# Patient Record
Sex: Female | Born: 1960 | Race: White | Hispanic: No | Marital: Married | Smoking: Never smoker
Health system: Southern US, Community
[De-identification: ages and names within clinical notes are randomized; demographics above are authoritative.]

## PROBLEM LIST (undated history)

## (undated) DIAGNOSIS — IMO0001 Reserved for inherently not codable concepts without codable children: Secondary | ICD-10-CM

## (undated) DIAGNOSIS — M069 Rheumatoid arthritis, unspecified: Secondary | ICD-10-CM

## (undated) DIAGNOSIS — K219 Gastro-esophageal reflux disease without esophagitis: Secondary | ICD-10-CM

## (undated) HISTORY — DX: Gastro-esophageal reflux disease without esophagitis: K21.9

## (undated) HISTORY — DX: Reserved for inherently not codable concepts without codable children: IMO0001

## (undated) HISTORY — PX: ABLATION: SHX5711

---

## 2004-11-29 ENCOUNTER — Ambulatory Visit: Payer: Self-pay | Admitting: Gastroenterology

## 2005-12-30 ENCOUNTER — Ambulatory Visit: Payer: Self-pay | Admitting: Unknown Physician Specialty

## 2007-08-09 ENCOUNTER — Observation Stay (HOSPITAL_COMMUNITY): Admission: EM | Admit: 2007-08-09 | Discharge: 2007-08-10 | Payer: Self-pay | Admitting: Emergency Medicine

## 2009-04-12 ENCOUNTER — Ambulatory Visit: Payer: Self-pay | Admitting: Unknown Physician Specialty

## 2009-10-16 ENCOUNTER — Ambulatory Visit: Payer: Self-pay | Admitting: Unknown Physician Specialty

## 2010-11-27 ENCOUNTER — Ambulatory Visit: Payer: Self-pay | Admitting: Unknown Physician Specialty

## 2010-11-29 ENCOUNTER — Ambulatory Visit: Payer: Self-pay | Admitting: Unknown Physician Specialty

## 2011-02-05 NOTE — Discharge Summary (Signed)
NAMECHENELLE, BENNING                ACCOUNT NO.:  [id number]   MEDICAL RECORD NO.:  [id number]          PATIENT TYPE:  INP   LOCATION:  3729                         FACILITY:  [facility]   PHYSICIAN:  Ritta Slot, MD     DATE OF BIRTH:  1960-11-04   DATE OF ADMISSION:  08/09/2007  DATE OF DISCHARGE:  08/10/2007                               DISCHARGE SUMMARY   Ms. Neal is a 50 year old white female who presents to [facility] ER  with complaints of palpitations with associated arm and chest  discomfort.  Symptoms began approximately one month ago.  She had been  seen by Dr. Lynnea Ferrier in the office and she had undergone an echo and was  awaiting monitor placement.  She has a repeat appointment to see him on  November 19.  She got scared; thus, she came to the emergency room.  It  was decided to keep her overnight for observation.  She ruled out for an  MI with negative CK-MBs and troponins.  Dr. Lynnea Ferrier saw her the  following morning and decided that she could be discharged home for  monitor placement.  This was setup prior to her discharge.  No  medication changes were made.  Discharge medications will be Prozac 20  mg everyday, Nexium 40 mg everyday.  She will have her Event monitor  placed at 8:30 on August 12, 2007.   LABS:  Hemoglobin 12.5, hematocrit 36.3, WBC 6.1 and platelets are 205.  Sodium 141, potassium 3.8, BUN 12, creatinine 0.71.  Magnesium was 2.3.  On admission, her potassium was slightly low at 3.3, that was replaced.  Total cholesterol was 171, but triglycerides 52, HDL was 50 and LDL was  111.   DISCHARGE DIAGNOSES:  1. Palpitations.  2. Gastroesophageal reflux disease.  3. Anxiety.      Lezlie Octave, N.P.      Ritta Slot, MD  Electronically Signed    BB/MEDQ  D:  08/10/2007  T:  08/10/2007  Job:  [id number]

## 2011-07-02 LAB — LIPID PANEL
HDL: 50
LDL Cholesterol: 111 — ABNORMAL HIGH
Total CHOL/HDL Ratio: 3.4
Triglycerides: 52
VLDL: 10

## 2011-07-02 LAB — I-STAT 8, (EC8 V) (CONVERTED LAB)
Acid-Base Excess: 1
BUN: 16
Bicarbonate: 25.1 — ABNORMAL HIGH
Chloride: 105
Glucose, Bld: 84
Operator id: 272551
Potassium: 3.3 — ABNORMAL LOW
Sodium: 137
TCO2: 26
pH, Ven: 7.435 — ABNORMAL HIGH

## 2011-07-02 LAB — BASIC METABOLIC PANEL
Calcium: 8.9
GFR calc Af Amer: >60
GFR calc non Af Amer: >60
Glucose, Bld: 89
Potassium: 3.8

## 2011-07-02 LAB — POCT CARDIAC MARKERS
CKMB, poc: <1 — ABNORMAL LOW
Myoglobin, poc: 54.2

## 2011-07-02 LAB — DIFFERENTIAL
Basophils Relative: 0
Eosinophils Relative: 1
Monocytes Relative: 7
Neutrophils Relative %: 63

## 2011-07-02 LAB — COMPREHENSIVE METABOLIC PANEL
Alkaline Phosphatase: 37 — ABNORMAL LOW
Chloride: 103
Potassium: 4.1
Total Protein: 6.5

## 2011-07-02 LAB — CBC
Hemoglobin: 12.5
MCHC: 34.2
MCHC: 34.4
MCV: 88.9
Platelets: 205
Platelets: 232
RBC: 4.3
WBC: 6.1
WBC: 7.6

## 2011-07-02 LAB — TROPONIN I
Troponin I: 0.02
Troponin I: 0.03

## 2011-07-02 LAB — CK TOTAL AND CKMB (NOT AT ARMC): Total CK: 39

## 2011-07-02 LAB — POCT I-STAT CREATININE
Creatinine, Ser: 0.9
Operator id: 272551

## 2011-07-02 LAB — APTT: aPTT: 34

## 2011-07-02 LAB — MAGNESIUM: Magnesium: 2.3

## 2011-12-03 ENCOUNTER — Ambulatory Visit: Payer: Self-pay | Admitting: Unknown Physician Specialty

## 2013-05-26 ENCOUNTER — Ambulatory Visit: Payer: Self-pay | Admitting: Obstetrics and Gynecology

## 2013-06-15 DIAGNOSIS — R7303 Prediabetes: Secondary | ICD-10-CM | POA: Insufficient documentation

## 2013-06-15 DIAGNOSIS — N943 Premenstrual tension syndrome: Secondary | ICD-10-CM | POA: Insufficient documentation

## 2013-06-15 DIAGNOSIS — K219 Gastro-esophageal reflux disease without esophagitis: Secondary | ICD-10-CM | POA: Insufficient documentation

## 2013-06-15 DIAGNOSIS — E785 Hyperlipidemia, unspecified: Secondary | ICD-10-CM | POA: Insufficient documentation

## 2013-06-15 DIAGNOSIS — L709 Acne, unspecified: Secondary | ICD-10-CM | POA: Insufficient documentation

## 2014-01-20 DIAGNOSIS — Z1331 Encounter for screening for depression: Secondary | ICD-10-CM | POA: Insufficient documentation

## 2014-01-20 DIAGNOSIS — K7689 Other specified diseases of liver: Secondary | ICD-10-CM | POA: Insufficient documentation

## 2014-07-29 ENCOUNTER — Ambulatory Visit (INDEPENDENT_AMBULATORY_CARE_PROVIDER_SITE_OTHER): Payer: BC Managed Care – PPO

## 2014-07-29 ENCOUNTER — Encounter: Payer: Self-pay | Admitting: *Deleted

## 2014-07-29 ENCOUNTER — Ambulatory Visit (INDEPENDENT_AMBULATORY_CARE_PROVIDER_SITE_OTHER): Payer: BC Managed Care – PPO | Admitting: Podiatry

## 2014-07-29 ENCOUNTER — Encounter: Payer: Self-pay | Admitting: Podiatry

## 2014-07-29 ENCOUNTER — Other Ambulatory Visit: Payer: Self-pay | Admitting: *Deleted

## 2014-07-29 VITALS — BP 121/81 | HR 89 | Resp 16 | Ht 67.0 in | Wt 170.0 lb

## 2014-07-29 DIAGNOSIS — M722 Plantar fascial fibromatosis: Secondary | ICD-10-CM

## 2014-07-29 MED ORDER — TRIAMCINOLONE ACETONIDE 10 MG/ML IJ SUSP
10.0000 mg | Freq: Once | INTRAMUSCULAR | Status: AC
Start: 2014-07-29 — End: 2014-07-29
  Administered 2014-07-29: 10 mg

## 2014-07-29 MED ORDER — PREDNISONE 10 MG PO TABS: ORAL_TABLET | ORAL | Status: DC

## 2014-07-29 NOTE — Patient Instructions (Signed)

## 2014-07-29 NOTE — Progress Notes (Signed)
   Subjective:    Patient ID: Deborah BullsKaren L Kellman, female    DOB: 07/02/1961, 53 y.o.   MRN: [id number]  HPI Comments: Pain in both feet for years now, the last year has been worse. The balls and the heels hurt.   They hurt after being on them for a little while   Foot Pain      Review of Systems  All other systems reviewed and are negative.      Objective:   Physical Exam        Assessment & Plan:

## 2014-07-31 NOTE — Progress Notes (Signed)
Subjective:     Patient ID: Deborah West, female   DOB: 12/11/1960, 53 y.o.   MRN: [id number]  HPIpatient states she's getting a lot of pain in the heels and forefeet of both feet that is not occur after first waking up in the morning but seems to get worse as the day goes on. States that it's reaching a point where it's becoming very hard for her to be on her feet or to be active   Review of Systems  All other systems reviewed and are negative.      Objective:   Physical Exam  Constitutional: She is oriented to person, place, and time.  Cardiovascular: Intact distal pulses.   Musculoskeletal: Normal range of motion.  Neurological: She is oriented to person, place, and time.  Skin: Skin is warm.  Nursing note and vitals reviewed. neurovascular status intact with muscle strength adequate and range of motion subtalar and midtarsal joint within normal limits. Patient is noted to have quite a bit of discomfort in the plantar aspects of the heels of both feet and moderate discomfort in the forefeet of both feet with inflammation around the metatarsals that is not as acute as the heel. Patient is noted to have good digital perfusion and is well oriented 3     Assessment:     Inflammatory plantar fasciitis of both feet and probable compensatory capsulitis bilateral    Plan:     H&P and x-rays reviewed. Today I injected the plantar fascia 3 mg Kenalog 5 mg Xylocaine and went ahead and applied fascially brace to both feet in order to lift the arch. Patient will be seen back to recheck

## 2014-08-12 ENCOUNTER — Ambulatory Visit (INDEPENDENT_AMBULATORY_CARE_PROVIDER_SITE_OTHER): Payer: BC Managed Care – PPO | Admitting: Podiatry

## 2014-08-12 VITALS — BP 131/74 | HR 90 | Resp 16

## 2014-08-12 DIAGNOSIS — M722 Plantar fascial fibromatosis: Secondary | ICD-10-CM

## 2014-08-12 NOTE — Progress Notes (Signed)
Subjective:     Patient ID: Deborah West, female   DOB: 09/17/1961, 53 y.o.   MRN: [id number]  HPI patient presents stating her heels are feeling quite a bit better than they were but still sore and she has had this for a long time   Review of Systems     Objective:   Physical Exam Neurovascular status intact with discomfort in the right plantar fascia and left plantar fascia of moderate intensity when palpated with moderate depression of the arch noted upon weightbearing    Assessment:     Continued plantar fasciitis of both heels secondary to structure with improvement with medication    Plan:     Finish medicine and instructed on physical therapy and scanned for custom orthotic devices. Reappoint when orthotics returned

## 2014-08-22 ENCOUNTER — Telehealth: Payer: Self-pay | Admitting: *Deleted

## 2014-08-22 NOTE — Telephone Encounter (Signed)
Pt called said she is having pain and she is wanting something so the inflammation will not build back up. Pt states she took cataflam wed-sun and it did help her. What do you suggest?

## 2014-08-22 NOTE — Telephone Encounter (Signed)
Called and left message letting pt know i received her phone call and no one was here on Wednesday to get her call requesting something for pain so it can get her through the Thanksgiving holiday.

## 2014-08-24 ENCOUNTER — Telehealth: Payer: Self-pay | Admitting: *Deleted

## 2014-08-24 NOTE — Telephone Encounter (Signed)
Called and left message for pt to return call. Needing to know mg for cataflam and how often she takes it.

## 2014-08-24 NOTE — Telephone Encounter (Signed)
She can have cataflam. I don't know the dosage for it

## 2014-08-25 ENCOUNTER — Other Ambulatory Visit: Payer: Self-pay | Admitting: *Deleted

## 2014-08-25 MED ORDER — DICLOFENAC POTASSIUM 50 MG PO TABS: 50.0000 mg | ORAL_TABLET | Freq: Four times a day (QID) | ORAL | Status: DC | PRN

## 2014-08-25 NOTE — Telephone Encounter (Signed)
Pt called said she takes cataflam four times daily. Per dr Charlsie Merlesregal fill rx. #[phone] refills.

## 2014-09-02 ENCOUNTER — Ambulatory Visit (INDEPENDENT_AMBULATORY_CARE_PROVIDER_SITE_OTHER): Payer: BC Managed Care – PPO | Admitting: *Deleted

## 2014-09-02 ENCOUNTER — Encounter: Payer: Self-pay | Admitting: Podiatry

## 2014-09-02 VITALS — BP 120/80 | HR 88 | Resp 16

## 2014-09-02 DIAGNOSIS — M722 Plantar fascial fibromatosis: Secondary | ICD-10-CM

## 2014-09-02 NOTE — Patient Instructions (Signed)

## 2014-09-02 NOTE — Progress Notes (Signed)
Pt presents for orthotic pick up , written and verbal instructions are given follow up as needed

## 2014-09-30 ENCOUNTER — Ambulatory Visit: Payer: BC Managed Care – PPO | Admitting: Podiatry

## 2014-11-08 ENCOUNTER — Ambulatory Visit (INDEPENDENT_AMBULATORY_CARE_PROVIDER_SITE_OTHER): Payer: BC Managed Care – PPO | Admitting: Podiatry

## 2014-11-08 ENCOUNTER — Encounter: Payer: Self-pay | Admitting: Podiatry

## 2014-11-08 VITALS — BP 136/77 | HR 84 | Resp 16

## 2014-11-08 DIAGNOSIS — M722 Plantar fascial fibromatosis: Secondary | ICD-10-CM

## 2014-11-08 MED ORDER — TRIAMCINOLONE ACETONIDE 10 MG/ML IJ SUSP
10.0000 mg | Freq: Once | INTRAMUSCULAR | Status: AC
Start: 2014-11-08 — End: 2014-11-08
  Administered 2014-11-08: 10 mg

## 2014-11-08 MED ORDER — TRIAMCINOLONE ACETONIDE 10 MG/ML IJ SUSP
10.0000 mg | Freq: Once | INTRAMUSCULAR | Status: AC
  Administered 2014-11-08: 10 mg

## 2014-11-08 MED ORDER — DICLOFENAC SODIUM 75 MG PO TBEC: 75.0000 mg | DELAYED_RELEASE_TABLET | Freq: Two times a day (BID) | ORAL | Status: DC

## 2014-11-08 NOTE — Progress Notes (Signed)
Subjective:     Patient ID: Deborah West, female   DOB: 03/28/1961, 54 y.o.   MRN: [id number]  HPI patient states [place]'s going to [place] on Thursday and has developed severe pain again in the plantar heel of approximate 10 day duration with no history of trauma   Review of Systems     Objective:   Physical Exam Neurovascular status intact with muscle strength adequate and severe pain just distal to the insertion into the calcaneus of the plantar fascia bilateral    Assessment:     Acute plantar fasciitis heel bilateral    Plan:     Injected the plantar fashion 3 mg Kenalog 5 g Xylocaine and dispensed night splint for each foot in order to stretch and also scanned for custom orthotic devices. Patient will use aggressive ice oral anti-inflammatories consisting of diclofenac and will be seen back again in approximately 10 days

## 2014-11-18 ENCOUNTER — Ambulatory Visit: Payer: BC Managed Care – PPO | Admitting: Podiatry

## 2014-11-25 ENCOUNTER — Ambulatory Visit (INDEPENDENT_AMBULATORY_CARE_PROVIDER_SITE_OTHER): Payer: BC Managed Care – PPO | Admitting: Podiatry

## 2014-11-25 VITALS — BP 126/77 | HR 87 | Resp 16

## 2014-11-25 DIAGNOSIS — M722 Plantar fascial fibromatosis: Secondary | ICD-10-CM

## 2014-11-25 MED ORDER — PREDNISONE 10 MG PO TABS: ORAL_TABLET | ORAL | Status: DC

## 2014-11-25 MED ORDER — TRIAMCINOLONE ACETONIDE 10 MG/ML IJ SUSP: 10.0000 mg | Freq: Once | INTRAMUSCULAR | Status: AC

## 2014-11-25 MED ORDER — TRIAMCINOLONE ACETONIDE 10 MG/ML IJ SUSP
10.0000 mg | Freq: Once | INTRAMUSCULAR | Status: AC
Start: 2014-11-25 — End: 2014-11-25
  Administered 2014-11-25: 10 mg

## 2014-11-25 NOTE — Progress Notes (Signed)
Subjective:     Patient ID: Deborah West, female   DOB: 03/09/1961, 54 y.o.   MRN: [id number]  HPI patient states there still really hurting even though she is utilizing her night splints and her orthotics. Stated that she is somewhat better but she was in [place] and was on her feet a lot   Review of Systems     Objective:   Physical Exam Neurovascular status intact with muscle strength adequate range of motion within normal limits and moderate discomfort plantar aspect of the heels bilateral with inflammation and fluid around the medial band    Assessment:     Plantar fasciitis bilateral still present despite numerous conservative treatments    Plan:     Reviewed continuation of stretching continuation of night splints and today I reinjected the plantar fascia for the last time 3 mg Kenalog 5 g Xylocaine and we'll see back in 1 month and may need to consider possible shockwave therapy

## 2014-12-08 ENCOUNTER — Other Ambulatory Visit: Payer: Self-pay | Admitting: Podiatry

## 2014-12-23 ENCOUNTER — Encounter: Payer: Self-pay | Admitting: Podiatry

## 2014-12-23 ENCOUNTER — Ambulatory Visit (INDEPENDENT_AMBULATORY_CARE_PROVIDER_SITE_OTHER): Payer: BC Managed Care – PPO | Admitting: Podiatry

## 2014-12-23 VITALS — BP 110/66 | HR 85 | Resp 16

## 2014-12-23 DIAGNOSIS — M722 Plantar fascial fibromatosis: Secondary | ICD-10-CM | POA: Diagnosis not present

## 2014-12-23 NOTE — Progress Notes (Signed)
Subjective:     Patient ID: Deborah BullsKaren L Januszewski, female   DOB: 02/05/1961, 54 y.o.   MRN: [id number]  HPI patient states my heels are feeling pretty good with a significant diminishment of discomfort and ability to walk distances with only mild pain   Review of Systems     Objective:   Physical Exam Neurovascular status intact with muscle strength adequate range of motion within normal limits. Patient's noted to have discomfort within the plantar heel of both feet with fluid buildup around the medial band that has reduced but is still present    Assessment:     Plantar fasciitis which is improving bilateral    Plan:     Instructed on physical therapy anti-inflammatories and continued orthotic and night splint usage. Reappoint as needed if symptoms indicate

## 2015-03-08 ENCOUNTER — Ambulatory Visit (INDEPENDENT_AMBULATORY_CARE_PROVIDER_SITE_OTHER): Payer: BC Managed Care – PPO | Admitting: Podiatry

## 2015-03-08 ENCOUNTER — Encounter: Payer: Self-pay | Admitting: Podiatry

## 2015-03-08 VITALS — BP 129/64 | HR 81 | Resp 16

## 2015-03-08 DIAGNOSIS — M722 Plantar fascial fibromatosis: Secondary | ICD-10-CM | POA: Diagnosis not present

## 2015-03-08 MED ORDER — DICLOFENAC SODIUM 1 % TD GEL: 4.0000 g | Freq: Four times a day (QID) | TRANSDERMAL | Status: AC

## 2015-03-09 ENCOUNTER — Telehealth: Payer: Self-pay | Admitting: *Deleted

## 2015-03-09 NOTE — Telephone Encounter (Signed)
[employer] faxed Voltaren gel approval Case Id: [id number] effective 02/07/2015 to 03/08/2016.

## 2015-03-09 NOTE — Progress Notes (Signed)
She presents today for surgical consult regarding bilateral foot. She states that my heels are still hurting. I wear the orthotics on a regular basis and get no relief.  Objective: Vital signs are stable she's alert and oriented 3. Pulses are strongly palpable. She has pain on palpation medial calcaneal tubercles bilateral. Right greater than left.  Assessment: Fasciitis right greater than left.  Plan: Chronic intractable plantar fasciitis bilateral. Discussed the pros and cons of surgical intervention we discussed endoscopic plantar fasciotomy today. We went over the consent form line by line but over by number giving her ample time to S1 motion she saw fit regarding an endoscopic plantar fasciotomy. I answered all her questions regarding this procedure to the best of my ability in layman's terms. She understood and was amenable to it. We did discuss the possible postop complications which may include but are not limited to postop pain bleeding swelling infection recurrence and need for further surgery. She was dispensed a cam walker. We injected her bilateral heels today with Kenalog and local anesthesia and she will follow-up with [place] in the fall for surgical intervention.

## 2015-03-28 ENCOUNTER — Telehealth: Payer: Self-pay | Admitting: Podiatry

## 2015-03-28 NOTE — Telephone Encounter (Signed)
Pt called and is scheduled for surgery on 7.29 for her left foot/pt is requesting to change her surgery to the right foot because it is causing a lot of pain.Please advise pt if she can just change it or what does she need to do

## 2015-03-31 ENCOUNTER — Telehealth: Payer: Self-pay | Admitting: *Deleted

## 2015-03-31 NOTE — Telephone Encounter (Signed)
"  My right foot is worse than the left foot now.  I would like to change my surgery.  Please give me a call."

## 2015-03-31 NOTE — Telephone Encounter (Signed)
Left message for patient to return my call.

## 2015-04-03 ENCOUNTER — Telehealth: Payer: Self-pay | Admitting: *Deleted

## 2015-04-03 NOTE — Telephone Encounter (Signed)
"  Surgery is scheduled for the 29th.  I would like to have foot switched from left to right."

## 2015-04-04 NOTE — Telephone Encounter (Signed)
Pt will come in and resign paperwork for the right foot

## 2015-04-20 ENCOUNTER — Other Ambulatory Visit: Payer: Self-pay | Admitting: Podiatry

## 2015-04-20 MED ORDER — PROMETHAZINE HCL 25 MG PO TABS: 25.0000 mg | ORAL_TABLET | Freq: Three times a day (TID) | ORAL | Status: DC | PRN

## 2015-04-20 MED ORDER — CEPHALEXIN 500 MG PO CAPS: 500.0000 mg | ORAL_CAPSULE | Freq: Three times a day (TID) | ORAL | Status: DC

## 2015-04-20 MED ORDER — OXYCODONE-ACETAMINOPHEN 10-325 MG PO TABS: 1.0000 | ORAL_TABLET | Freq: Four times a day (QID) | ORAL | Status: DC | PRN

## 2015-04-21 ENCOUNTER — Encounter: Payer: Self-pay | Admitting: Podiatry

## 2015-04-21 DIAGNOSIS — M722 Plantar fascial fibromatosis: Secondary | ICD-10-CM | POA: Diagnosis not present

## 2015-04-26 ENCOUNTER — Encounter: Payer: Self-pay | Admitting: Podiatry

## 2015-04-26 ENCOUNTER — Ambulatory Visit (INDEPENDENT_AMBULATORY_CARE_PROVIDER_SITE_OTHER): Payer: BC Managed Care – PPO | Admitting: Podiatry

## 2015-04-26 VITALS — BP 137/71 | HR 85 | Resp 16

## 2015-04-26 DIAGNOSIS — M722 Plantar fascial fibromatosis: Secondary | ICD-10-CM

## 2015-04-26 DIAGNOSIS — Z9889 Other specified postprocedural states: Secondary | ICD-10-CM

## 2015-04-26 NOTE — Progress Notes (Signed)
She presents today 1 week status post endoscopic plantar fasciotomy left foot. She denies fever chills nausea vomiting muscle aches and pains.  Objective: Vital signs are stable she is alert and oriented 3 pulses are strongly palpable was the dry sterile dressing was removed. She has bruising to the dorsal aspect of her foot as well as the forefoot left. This is more than likely desiccated with compression dressing or the boot. Sutures intact margins remain "well coapted. No signs of infection.  Assessment well-healing endoscopic plantar fasciotomy.  Plan: Follow up with her in 1 week for suture removal. She is to continue the use of the Cam Walker at all times. She may start soaking the foot in essence also warm water.

## 2015-05-03 ENCOUNTER — Encounter: Payer: Self-pay | Admitting: Podiatry

## 2015-05-03 ENCOUNTER — Ambulatory Visit (INDEPENDENT_AMBULATORY_CARE_PROVIDER_SITE_OTHER): Payer: BC Managed Care – PPO | Admitting: Podiatry

## 2015-05-03 VITALS — BP 121/71 | HR 81 | Resp 16

## 2015-05-03 DIAGNOSIS — Z9889 Other specified postprocedural states: Secondary | ICD-10-CM

## 2015-05-03 DIAGNOSIS — M722 Plantar fascial fibromatosis: Secondary | ICD-10-CM

## 2015-05-03 MED ORDER — CEPHALEXIN 500 MG PO CAPS: 500.0000 mg | ORAL_CAPSULE | Freq: Three times a day (TID) | ORAL | Status: DC

## 2015-05-03 MED ORDER — OXYCODONE-ACETAMINOPHEN 10-325 MG PO TABS: 1.0000 | ORAL_TABLET | ORAL | Status: DC | PRN

## 2015-05-03 NOTE — Progress Notes (Signed)
She presents today for follow up epf right foot dos 7/26.  Pain to plantar forefoot.  Heel feels great.  O:  VVS. Surgical site margins well coapted and free of infection.  Sutures removed and remain coapted.  FF demonstrates eccymosis plantar with ulceration  A:  Well healing surgical foot complicated by ff plantar bruising and pain.  P:  Sutures removed today and no signs of infection. May go back to work .  Started antibiotics and prescribed pain meds.  F/u in 2 weeks.

## 2015-05-03 NOTE — Progress Notes (Signed)
DOS 04/21/2015 Endoscopic plantar fasciotomy right foot.

## 2015-05-17 ENCOUNTER — Ambulatory Visit (INDEPENDENT_AMBULATORY_CARE_PROVIDER_SITE_OTHER): Payer: BC Managed Care – PPO | Admitting: Podiatry

## 2015-05-17 ENCOUNTER — Telehealth: Payer: Self-pay | Admitting: *Deleted

## 2015-05-17 ENCOUNTER — Encounter: Payer: Self-pay | Admitting: Podiatry

## 2015-05-17 VITALS — BP 119/79 | HR 87 | Resp 16

## 2015-05-17 DIAGNOSIS — M722 Plantar fascial fibromatosis: Secondary | ICD-10-CM

## 2015-05-17 DIAGNOSIS — Z9889 Other specified postprocedural states: Secondary | ICD-10-CM

## 2015-05-17 MED ORDER — MUPIROCIN CALCIUM 2 % EX CREA: 1.0000 "application " | TOPICAL_CREAM | Freq: Two times a day (BID) | CUTANEOUS | Status: DC

## 2015-05-17 NOTE — Telephone Encounter (Signed)
Pt states she is running out of the Mupirocin ointment and Dr. Al Corpus wanted her to continue.  Dr. Al Corpus ordered refill Mupirocin.

## 2015-05-17 NOTE — Progress Notes (Signed)
She presents today 2 weeks status post EPF right foot. States that the EPF site is doing very well however the abrasions and superficial ulcerations to the dorsal aspect of the foot and the forefoot secondary to severe swelling and rubbing against the dressing is more painful than anything else. She denies fever chills nausea vomiting muscle aches and pains of the pain in the dorsal and plantar aspect of the right foot from the dressing.  Objective: Vital signs are stable she is oriented 3. Sutures are intact margins well coapted there is no erythema edema cellulitis drainage or odor. Reactive hyperkeratosis with blood beneath the A4 foot demonstrates an area of irritation more than likely secondary to swelling and the use of the cam boot. She also has irritation dorsal aspect of the right foot with superficial skin breakdown.  Assessment: To iatrogenic lesions to the plantar aspect of dorsal aspect of the right foot. Surgical site appears to be healing very well.  Plan: Redressed the foot today dry sterile compressive dressing at the nonsurgical sites. Removed the stitches at the surgical sites. I will follow-up with her in 2 weeks.

## 2015-05-31 ENCOUNTER — Ambulatory Visit (INDEPENDENT_AMBULATORY_CARE_PROVIDER_SITE_OTHER): Payer: BC Managed Care – PPO | Admitting: Podiatry

## 2015-05-31 ENCOUNTER — Encounter: Payer: Self-pay | Admitting: Podiatry

## 2015-05-31 VITALS — BP 114/71 | HR 80 | Resp 16

## 2015-05-31 DIAGNOSIS — Z9889 Other specified postprocedural states: Secondary | ICD-10-CM

## 2015-05-31 DIAGNOSIS — M722 Plantar fascial fibromatosis: Secondary | ICD-10-CM

## 2015-05-31 NOTE — Progress Notes (Signed)
Jaycie presents today for follow-up of her endoscopic plantar fasciotomy right foot 6 weeks. She states that the pain in the heel has completely resolved I still have the callus on the plantar aspect of the forefoot which is painful. She also states that her left foot is painful and she would like to consider an endoscopic fasciotomy for this one as well.  Objective: Vital signs are stable she is alert and oriented 3. Pulses are strongly palpable bilateral. No pain on palpation to the surgical site right. Left foot demonstrate strong palpable pulses with pain on palpation medial calcaneal tubercle of the left heel.  Assessment: Well-healing surgical foot right. Plantar fasciitis left heel.  Plan: I will follow-up with her in 1 month for her right heel. I encouraged her to continue sleeping the night splint. I also went over a consent form today line by line number by number giving her ample time to ask questions she saw fit regarding her left foot and an EPF to that foot. She understood this and was amenable to it. We will follow up with her in the surgery Center in the near future.

## 2015-06-06 ENCOUNTER — Telehealth: Payer: Self-pay | Admitting: *Deleted

## 2015-06-06 NOTE — Telephone Encounter (Signed)
"  I'm returning your call.  You wanted to schedule surgery?  "I sure do, I didn't know if I was supposed to call you or what."  When would you like to schedule?  "I'd like to do it on September 30th."  Okay, we'll get it scheduled then.  Go ahead and register with the surgical center.  "Okay, I will.  Thank you so much."

## 2015-06-06 NOTE — Telephone Encounter (Signed)
"  I had surgery on my right foot July 29.  They cut the ligament for Plantar Fasciitis.  I'm ready to have it done on my left foot.  I don't remember who he told me to call.  If you were to call me or I was to call you.  I was there last Wednesday.  So I'm just giving a call.  I can be reached at home this afternoon.  Tomorrow I can be reached at work.

## 2015-06-22 ENCOUNTER — Other Ambulatory Visit: Payer: Self-pay | Admitting: Podiatry

## 2015-06-22 MED ORDER — PROMETHAZINE HCL 25 MG PO TABS: 25.0000 mg | ORAL_TABLET | Freq: Three times a day (TID) | ORAL | Status: DC | PRN

## 2015-06-22 MED ORDER — OXYCODONE-ACETAMINOPHEN 10-325 MG PO TABS: 1.0000 | ORAL_TABLET | Freq: Four times a day (QID) | ORAL | Status: DC | PRN

## 2015-06-22 MED ORDER — CEPHALEXIN 500 MG PO CAPS: 500.0000 mg | ORAL_CAPSULE | Freq: Three times a day (TID) | ORAL | Status: DC

## 2015-06-23 DIAGNOSIS — M722 Plantar fascial fibromatosis: Secondary | ICD-10-CM | POA: Diagnosis not present

## 2015-06-28 ENCOUNTER — Encounter: Payer: Self-pay | Admitting: Podiatry

## 2015-06-28 ENCOUNTER — Ambulatory Visit (INDEPENDENT_AMBULATORY_CARE_PROVIDER_SITE_OTHER): Payer: BC Managed Care – PPO | Admitting: Podiatry

## 2015-06-28 ENCOUNTER — Encounter: Payer: BC Managed Care – PPO | Admitting: Podiatry

## 2015-06-28 VITALS — BP 124/76 | HR 89 | Resp 16

## 2015-06-28 DIAGNOSIS — Z9889 Other specified postprocedural states: Secondary | ICD-10-CM

## 2015-06-28 DIAGNOSIS — M722 Plantar fascial fibromatosis: Secondary | ICD-10-CM

## 2015-06-28 MED ORDER — OXYCODONE-ACETAMINOPHEN 10-325 MG PO TABS: 1.0000 | ORAL_TABLET | Freq: Four times a day (QID) | ORAL | Status: DC | PRN

## 2015-06-28 NOTE — Progress Notes (Signed)
She presents today for follow-up first postop visit endoscopic plantar fasciotomy left foot. She denies fever chills nausea vomiting muscle aches and pains. She states that this surgery was a little more tender than the previous EPS.  Objective: Vital signs are stable alert and oriented 3. Pulses are strongly palpable. Dressed sterile dressing intact once removed demonstrates arms well coapted sutures in place mild ecchymosis to the medial longitudinal arch mild tenderness on palpation. No ulcerations no lesions no abrasions and no blisters. No signs of infection.  Assessment: Well-healing surgical foot status post 5 days.  Plan: Redressed with Band-Aids today continue to wear the Cam Walker for 24 hours a day I will follow-up with her in 1 week for suture removal.

## 2015-07-05 ENCOUNTER — Encounter: Payer: Self-pay | Admitting: Podiatry

## 2015-07-05 ENCOUNTER — Ambulatory Visit (INDEPENDENT_AMBULATORY_CARE_PROVIDER_SITE_OTHER): Payer: BC Managed Care – PPO | Admitting: Podiatry

## 2015-07-05 VITALS — BP 118/74 | HR 96 | Resp 16

## 2015-07-05 DIAGNOSIS — M722 Plantar fascial fibromatosis: Secondary | ICD-10-CM

## 2015-07-05 DIAGNOSIS — Z9889 Other specified postprocedural states: Secondary | ICD-10-CM

## 2015-07-05 NOTE — Progress Notes (Signed)
She presents today for follow-up of her endoscopic plantar fasciotomy of her left foot. She states that her right foot which had endoscopic fasciotomy couple of months ago and is doing very well. She states this left foot is doing very well.  Objective: Vital signs are stable she is alert and oriented 3. Pulses are intact. Sutures are intact margins are well coapted. So we removed the sutures today margins remain well coapted. No signs of infection.  Assessment: I placed her in a compression anklet and recommended that she get back into her tennis shoes with her orthotics. Also suggested that she continue wearing the night splint for another month.  Plan follow-up with me in 3 weeks.

## 2015-07-26 ENCOUNTER — Encounter: Payer: Self-pay | Admitting: Podiatry

## 2015-07-26 ENCOUNTER — Ambulatory Visit (INDEPENDENT_AMBULATORY_CARE_PROVIDER_SITE_OTHER): Payer: BC Managed Care – PPO | Admitting: Podiatry

## 2015-07-26 DIAGNOSIS — M722 Plantar fascial fibromatosis: Secondary | ICD-10-CM

## 2015-07-26 DIAGNOSIS — Z9889 Other specified postprocedural states: Secondary | ICD-10-CM

## 2015-07-27 NOTE — Progress Notes (Signed)
She presents today 5 weeks status post EPF left foot. She states the right foot is doing very well after its EPF except for the tenderness in the forefoot. She continues to treat that with diclofenac gel and contrast baths. Left foot however she states is doing pretty well still has some tenderness but not like prior to surgery.  Objective: Vital signs stable she is alert and oriented 3 she has minimal pain on palpation medial calcaneal tubercle of the left heel palpation medial calcaneal tubercle right heel tenderness on palpation of the forefoot right.  Assessment: Well-healing surgical foot bilateral. Left foot was performed 06/23/2015.  Plan: Discussed etiology pathology conservative versus surgical therapies I will follow up with her in 1 month. I encouraged her to get back to her regular routine.  Arbutus Pedodd Gidget Quizhpi DPM

## 2015-08-30 ENCOUNTER — Ambulatory Visit: Payer: BC Managed Care – PPO | Admitting: Podiatry

## 2015-08-30 ENCOUNTER — Encounter: Payer: Self-pay | Admitting: Podiatry

## 2015-08-30 ENCOUNTER — Ambulatory Visit (INDEPENDENT_AMBULATORY_CARE_PROVIDER_SITE_OTHER): Payer: BC Managed Care – PPO | Admitting: Podiatry

## 2015-08-30 VITALS — BP 119/72 | HR 90 | Resp 18

## 2015-08-30 DIAGNOSIS — Z9889 Other specified postprocedural states: Secondary | ICD-10-CM | POA: Diagnosis not present

## 2015-08-30 DIAGNOSIS — M722 Plantar fascial fibromatosis: Secondary | ICD-10-CM | POA: Diagnosis not present

## 2015-08-30 NOTE — Progress Notes (Signed)
She presents today for follow-up of her endoscopic plantar fasciotomy right heel which is now throbbing at nighttime but does find during the daytime and her left foot is doing just great with its endoscopic plantar fasciotomy.  Objective: Vital signs are stable she is alert and oriented 3 she has mild tenderness on palpation medial calcaneal tubercle of the right heel. And on the central plantar tubercle of the right heel. Left heel is in good shape and no pain.  Assessment: Healing surgical foot left mild central band plantar fasciitis right.  Plan: Reinjected that area today with Kenalog and local anesthetic follow-up with her in 1 month if necessary.

## 2015-09-04 ENCOUNTER — Encounter: Payer: Self-pay | Admitting: Podiatry

## 2015-10-09 ENCOUNTER — Encounter: Payer: BC Managed Care – PPO | Admitting: Podiatry

## 2015-10-25 ENCOUNTER — Encounter: Payer: BC Managed Care – PPO | Admitting: Podiatry

## 2015-11-08 ENCOUNTER — Encounter: Payer: BC Managed Care – PPO | Admitting: Podiatry

## 2015-12-04 ENCOUNTER — Encounter: Payer: Self-pay | Admitting: Podiatry

## 2015-12-04 ENCOUNTER — Ambulatory Visit (INDEPENDENT_AMBULATORY_CARE_PROVIDER_SITE_OTHER): Payer: BC Managed Care – PPO | Admitting: Podiatry

## 2015-12-04 VITALS — BP 138/70 | HR 100 | Resp 12

## 2015-12-04 DIAGNOSIS — M722 Plantar fascial fibromatosis: Secondary | ICD-10-CM

## 2015-12-04 NOTE — Progress Notes (Signed)
She presents today for follow-up of her plantar fasciitis. Her last plantar fascial surgery was performed 06/23/2015. She states that she was doing well for a long time and then started to redevelop pain as we saw on her last visit. She states that the only thing different in her life is the fact that she started taking Lipitor. Last visit in December her left foot was doing very well now she states that her left foot is just as bad as her right foot. She states that the pain is much different than previous she said they does throb all the time now.  Objective: Vital signs are stable alert and oriented 3 pulses are palpable. She still has some tenderness on palpation of the entire heel laterally.  Assessment: Rule out an enthesopathy associated with the Lipitor.  Plan: I encouraged her to discontinue Lipitor for 1 month and reevaluate her symptoms. I will follow-up with her in 1 month and consider alcohol injections if necessary.

## 2016-01-08 ENCOUNTER — Encounter: Payer: BC Managed Care – PPO | Admitting: Podiatry

## 2016-01-18 NOTE — Progress Notes (Signed)
This encounter was created in error - please disregard.

## 2016-01-24 ENCOUNTER — Encounter: Payer: Self-pay | Admitting: Podiatry

## 2016-01-24 ENCOUNTER — Ambulatory Visit (INDEPENDENT_AMBULATORY_CARE_PROVIDER_SITE_OTHER): Payer: BC Managed Care – PPO | Admitting: Podiatry

## 2016-01-24 VITALS — BP 121/69 | HR 89 | Resp 18

## 2016-01-24 DIAGNOSIS — M722 Plantar fascial fibromatosis: Secondary | ICD-10-CM

## 2016-01-24 NOTE — Progress Notes (Signed)
She presents today for continued pain to the bilateral lower extremities. She states that her plantar fasciitis is exquisitely painful by the end of the day and by the end of the week her legs are swollen. She has discontinued her Lipitor for the past 6 weeks. She states that has not made any difference. She states that no anti-inflammatory seems to help and the only thing that seems to help as a narcotic which she does not like to take.  Objective: Vital signs are stable alert and oriented 3. Pulses are palpable. She has moderate severe pain on palpation medial continued tubercles bilateral. No swelling of the bilateral ankles yet.  Assessment: Chronic intractable plantar fasciitis status post EPF bilateral.  Plan: We scheduled her for physical therapy today.

## 2016-02-12 ENCOUNTER — Ambulatory Visit: Payer: BC Managed Care – PPO | Admitting: Podiatry

## 2018-08-28 ENCOUNTER — Other Ambulatory Visit: Payer: Self-pay

## 2018-08-28 ENCOUNTER — Encounter: Payer: Self-pay | Admitting: Emergency Medicine

## 2018-08-28 ENCOUNTER — Emergency Department
Admission: EM | Admit: 2018-08-28 | Discharge: 2018-08-28 | Disposition: A | Discharge status: Home / Self Care | Payer: BC Managed Care – PPO | Attending: Emergency Medicine | Admitting: Emergency Medicine

## 2018-08-28 ENCOUNTER — Emergency Department: Payer: BC Managed Care – PPO

## 2018-08-28 DIAGNOSIS — Y92810 Car as the place of occurrence of the external cause: Secondary | ICD-10-CM | POA: Insufficient documentation

## 2018-08-28 DIAGNOSIS — S60222A Contusion of left hand, initial encounter: Secondary | ICD-10-CM | POA: Diagnosis not present

## 2018-08-28 DIAGNOSIS — W231XXA Caught, crushed, jammed, or pinched between stationary objects, initial encounter: Secondary | ICD-10-CM | POA: Insufficient documentation

## 2018-08-28 DIAGNOSIS — Y998 Other external cause status: Secondary | ICD-10-CM | POA: Diagnosis not present

## 2018-08-28 DIAGNOSIS — S6742XA Crushing injury of left wrist and hand, initial encounter: Secondary | ICD-10-CM | POA: Diagnosis not present

## 2018-08-28 DIAGNOSIS — S6992XA Unspecified injury of left wrist, hand and finger(s), initial encounter: Secondary | ICD-10-CM | POA: Diagnosis present

## 2018-08-28 DIAGNOSIS — Y9389 Activity, other specified: Secondary | ICD-10-CM | POA: Diagnosis not present

## 2018-08-28 DIAGNOSIS — T148XXA Other injury of unspecified body region, initial encounter: Secondary | ICD-10-CM

## 2018-08-28 HISTORY — DX: Rheumatoid arthritis, unspecified: M06.9

## 2018-08-28 MED ORDER — ACETAMINOPHEN 325 MG PO TABS
650.0000 mg | ORAL_TABLET | Freq: Once | ORAL | Status: AC
  Administered 2018-08-28: 650 mg via ORAL
  Filled 2018-08-28: qty 2

## 2018-08-28 NOTE — ED Provider Notes (Signed)
[facility] Emergency Department Provider Note  ____________________________________________   First MD Initiated Contact with Patient 08/28/18 1942     (approximate)  I have reviewed the triage vital signs and the nursing notes.   HISTORY  Chief Complaint Hand Pain    HPI Deborah West is a 57 y.o. female presents emergency department complaining of left wrist pain.  She was putting in her mother's wheelchair in the car and her wrist and hand got stuck in between 2 bar in she is unsure which direction it was twisted.  She states she has had pain and bruising since this morning.  She denies any other injuries.    Past Medical History:  Diagnosis Date  . RA (rheumatoid arthritis) (HCC)   . Reflux     Patient Active Problem List   Diagnosis Date Noted  . Screening for depression 01/20/2014  . Hepatic cyst 01/20/2014  . Acne 06/15/2013  . Acid reflux 06/15/2013  . HLD (hyperlipidemia) 06/15/2013  . Menstrual molimen 06/15/2013  . Borderline diabetes 06/15/2013    Past Surgical History:  Procedure Laterality Date  . ABLATION      Prior to Admission medications   Medication Sig Start Date End Date Taking? Authorizing Provider  diclofenac (VOLTAREN) 75 MG EC tablet TAKE 1 TABLET BY MOUTH TWICE DAILY 12/23/14   Lenn Sinkegal, Norman S, DPM  diclofenac sodium (VOLTAREN) 1 % GEL Apply 4 g topically 4 (four) times daily. 03/08/15   Hyatt, Max T, DPM  diltiazem (CARTIA XT) 240 MG 24 hr capsule Take 240 mg by mouth. 05/05/14 05/06/15  [provider]  estradiol (ESTRACE) 2 MG tablet Take 1 mg by mouth. 07/25/14   [provider]  FLUoxetine (PROZAC) 20 MG capsule TAKE 1 CAPSULE BY MOUTH DAILY 07/29/14   [provider]  medroxyPROGESTERone (PROVERA) 2.5 MG tablet Take 1.25 mg by mouth. 07/25/14   [provider]  omeprazole (PRILOSEC) 40 MG capsule Take 40 mg by mouth. 01/10/14   [provider]    Allergies Sulfa  antibiotics and Ciprofloxacin  History reviewed. No pertinent family history.  Social History Social History   Tobacco Use  . Smoking status: Never Smoker  . Smokeless tobacco: Never Used  Substance Use Topics  . Alcohol use: Yes    Alcohol/week: 0.0 standard drinks    Comment: rarely  . Drug use: Never    Review of Systems  Constitutional: No fever/chills Eyes: No visual changes. ENT: No sore throat. Respiratory: Denies cough Genitourinary: Negative for dysuria. Musculoskeletal: Negative for back pain.  Left wrist pain Skin: Negative for rash.    ____________________________________________   PHYSICAL EXAM:  VITAL SIGNS: ED Triage Vitals  Enc Vitals Group     BP 08/28/18 1925 109/82     Pulse Rate 08/28/18 1925 95     Resp 08/28/18 1925 16     Temp 08/28/18 1925 97.9 F (36.6 C)     Temp Source 08/28/18 1925 Oral     SpO2 08/28/18 1925 100 %     Weight 08/28/18 1926 164 lb (74.4 kg)     Height 08/28/18 1926 5\' 7"  (1.702 m)     Head Circumference --      Peak Flow --      Pain Score 08/28/18 1926 5     Pain Loc --      Pain Edu? --      Excl. in GC? --     Constitutional: Alert and oriented. Well  appearing and in no acute distress. Eyes: Conjunctivae are normal.  Head: Atraumatic. Nose: No congestion/rhinnorhea. Mouth/Throat: Mucous membranes are moist.   Neck:  supple no lymphadenopathy noted Cardiovascular: Normal rate, regular rhythm.  Respiratory: Normal respiratory effort.  No retractions GU: deferred Musculoskeletal: FROM all extremities, warm and well perfused.  The left hand and wrist are bruised and tender.  Snuffbox is mildly tender.  Full range of motion is intact.  Neurovascular is intact Neurologic:  Normal speech and language.  Skin:  Skin is warm, dry and intact. No rash noted. Psychiatric: Mood and affect are normal. Speech and behavior are normal.  ____________________________________________   LABS (all labs ordered are listed,  but only abnormal results are displayed)  Labs Reviewed - No data to display ____________________________________________   ____________________________________________  RADIOLOGY  X-ray of the left wrist and left hand are negative for fracture  ____________________________________________   PROCEDURES  Procedure(s) performed: Velcro thumb spica splint was applied due to the snuffbox tenderness  Procedures    ____________________________________________   INITIAL IMPRESSION / ASSESSMENT AND PLAN / ED COURSE  Pertinent labs & imaging results that were available during my care of the patient were reviewed by me and considered in my medical decision making (see chart for details).   Patient is a 57 year old female presents emergency department complaining of left wrist and hand pain after an injury.  Physical exam shows a bruised left hand and wrist.  Some swelling is noted.  Neurovascular is intact.  X-ray of the left wrist and hand are negative for fracture  The patient was tender in the snuffbox so a Velcro thumb spica splint was applied.  She is to follow-up with an orthopedist in her area or return to [facility] and see someone at [facility].  She was given a dose of Tylenol while here in the emergency department.  She is to elevate and ice the hand and wrist.  She states she understands will comply.  She is discharged in stable condition.     As part of my medical decision making, I reviewed the following data within the electronic MEDICAL RECORD NUMBER Nursing notes reviewed and incorporated, Old chart reviewed, Radiograph reviewed x-ray of the left wrist and hand are negative for fracture, Notes from prior ED visits and [place] Controlled Substance Database  ____________________________________________   FINAL CLINICAL IMPRESSION(S) / ED DIAGNOSES  Final diagnoses:  Contusion of left hand, initial encounter  Crush injury      NEW MEDICATIONS STARTED DURING  THIS VISIT:  New Prescriptions   No medications on file     Note:  This document was prepared using Dragon voice recognition software and may include unintentional dictation errors.    Faythe Ghee, PA-C 08/28/18 2033    Jeanmarie Plant, MD 08/28/18 [phone]

## 2018-08-28 NOTE — Discharge Instructions (Addendum)
Follow-up with an orthopedic of your choice at your home or [facility]dis [facility].  [facility] does have a hand specialist if needed.  Wear the splint for comfort.  If you continue to have pain in 7 to 10 days this area needs to be rechecked.  Apply ice to the area.

## 2018-08-28 NOTE — ED Triage Notes (Signed)
Pt to ED c/o left wrist pain today after injury folding up wheelchair and wrist got stuck between two metal bars.  No obvious deformity or swelling noted, skin intact.  (+) movement in left fingers and wrist.

## 2019-08-05 IMAGING — DX DG WRIST COMPLETE 3+V*L*
4 series · 4 of 4 positions shown · non-contrast
Comparison: None.

CLINICAL DATA: Injured today with subsequent pain.

EXAM:
LEFT WRIST - COMPLETE 3+ VIEW

[wrist ap (1 of 2)]
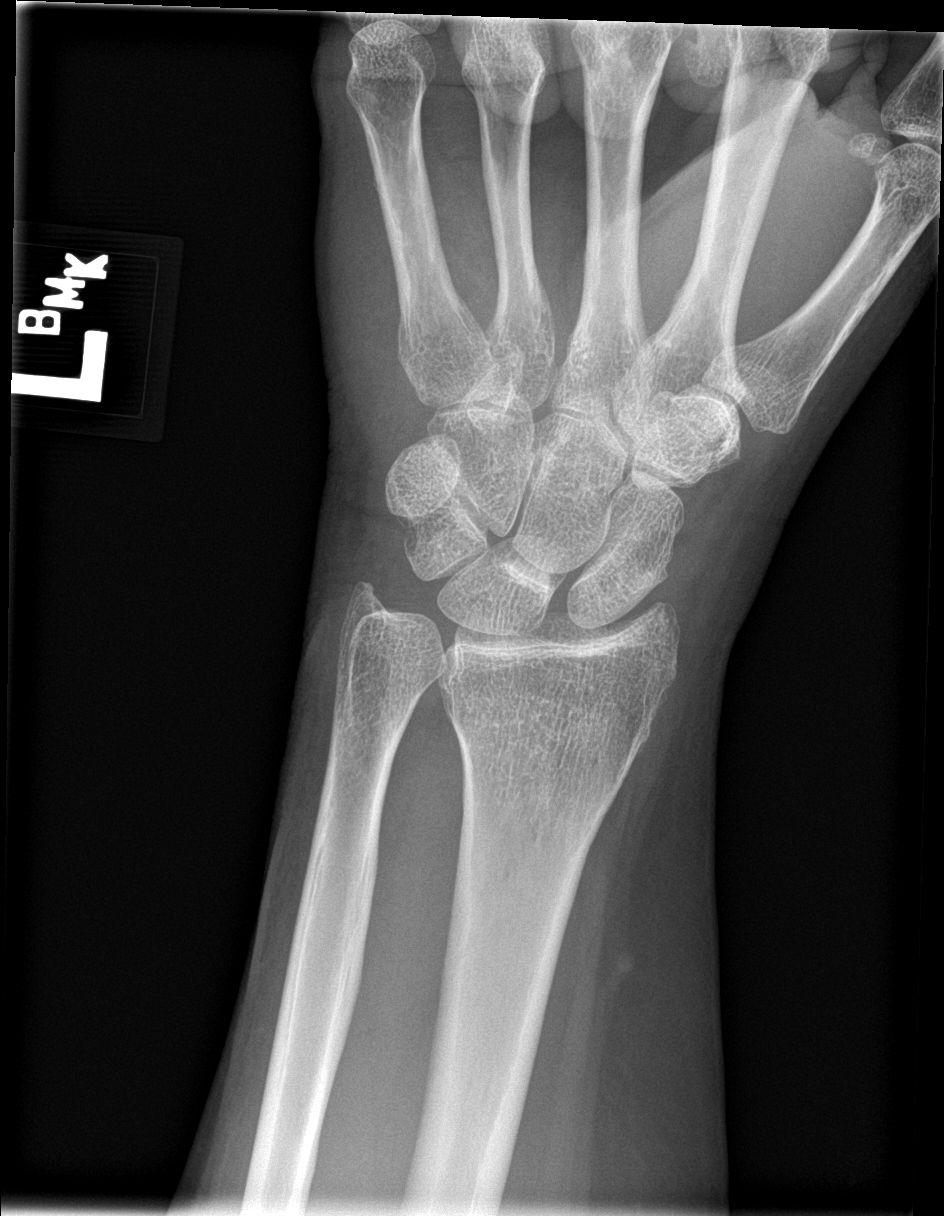

[wrist obl]
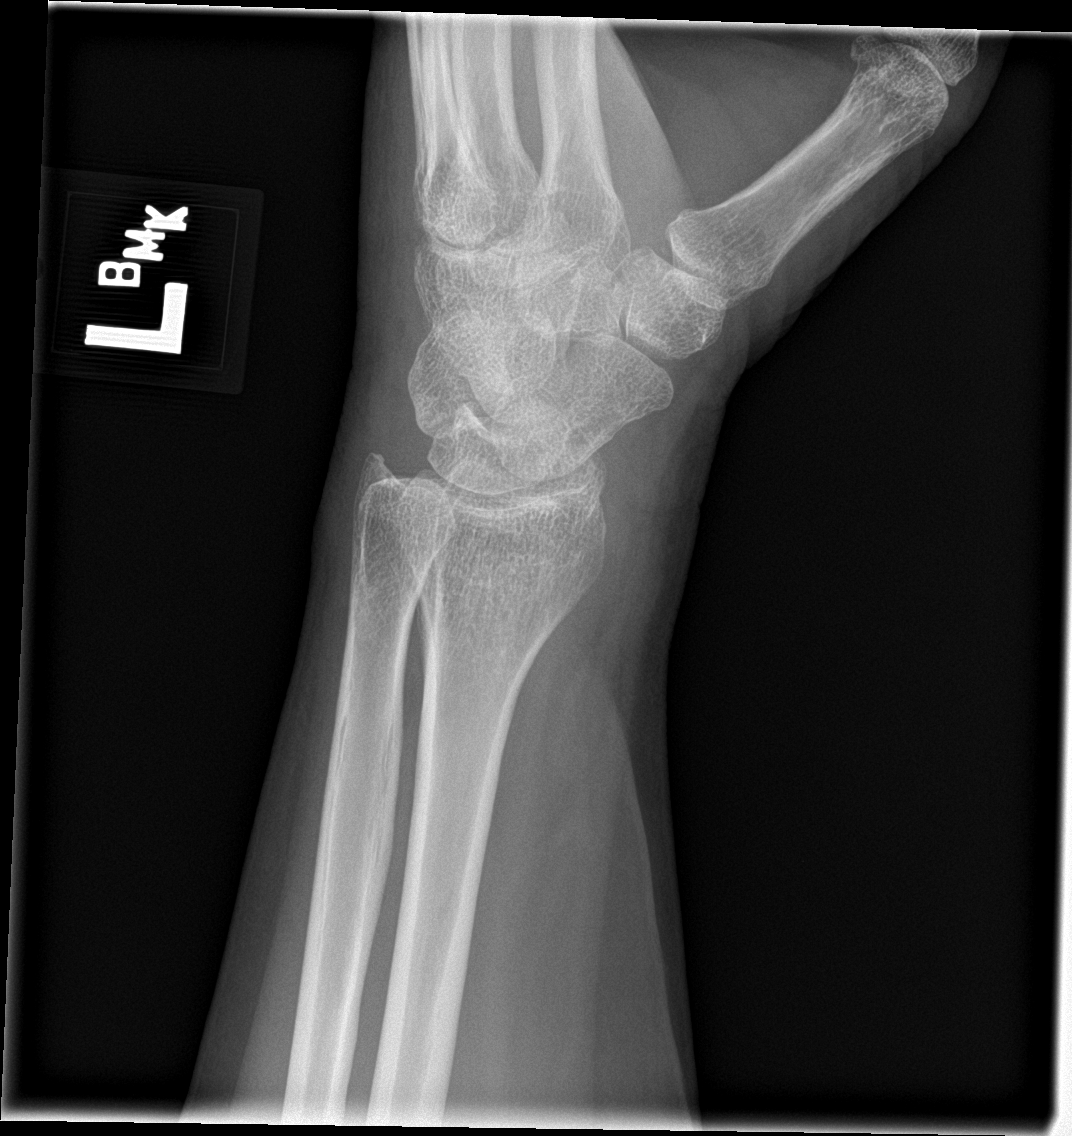

[wrist lat]
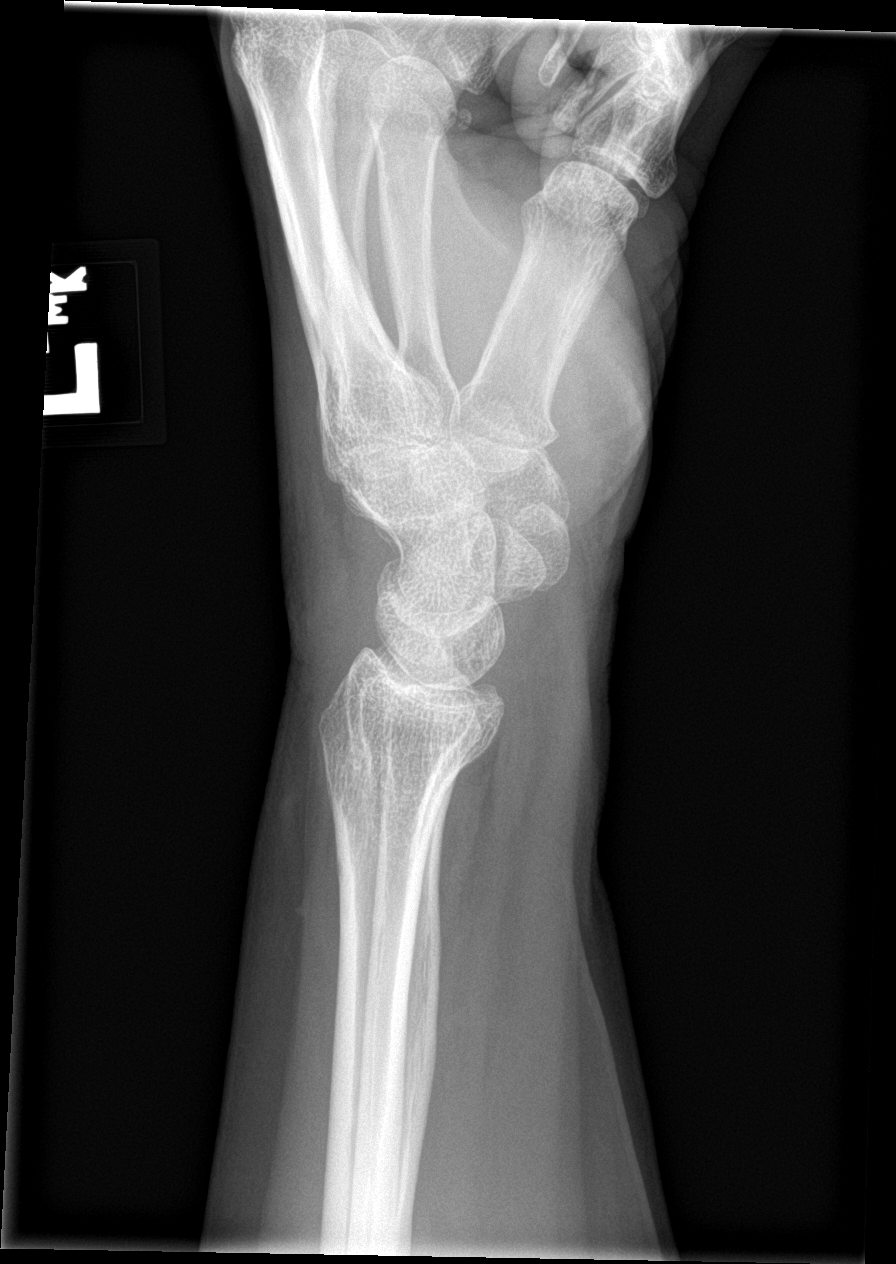

[wrist ap (2 of 2)]
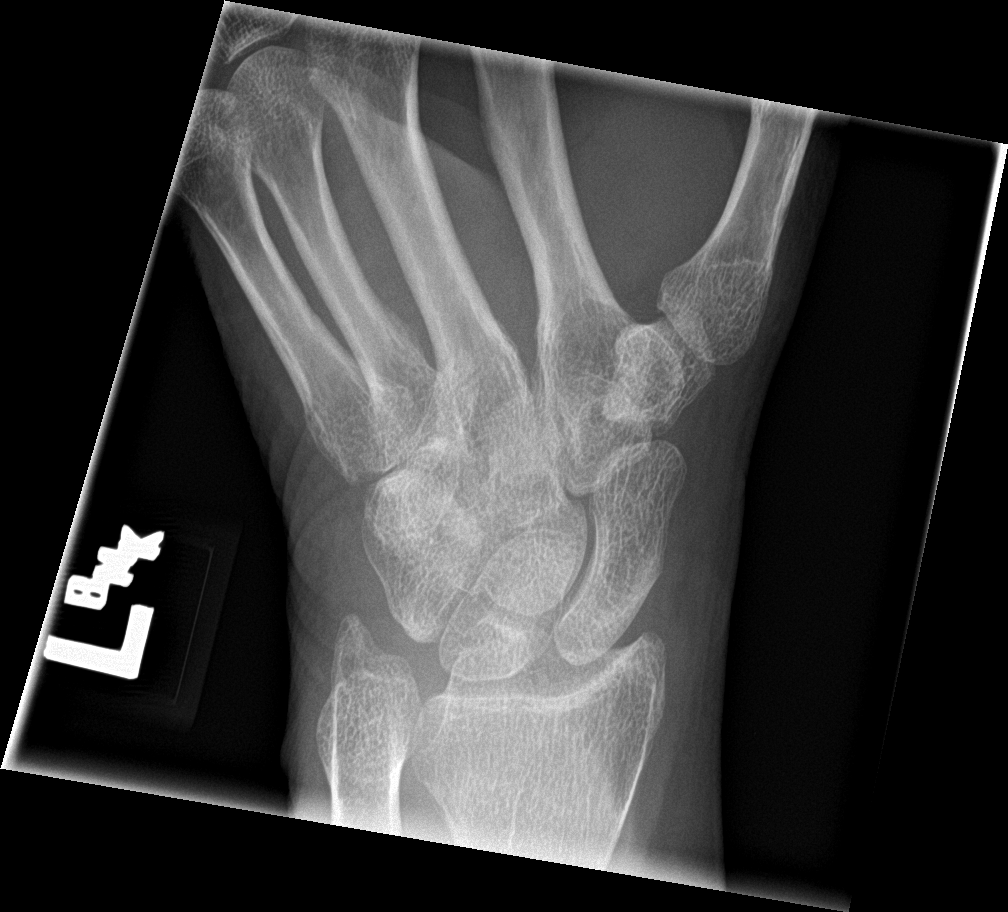

[4 of 4 positions shown; findings below may reference images not displayed]

FINDINGS: No evidence of fracture or degenerative change. Borderline widening
between the navicular and scaphoid that could indicate ligamentous
injury. This could be pre-existing.
IMPRESSION: No acute bone finding. Mild widening of the scapholunate distance
that could be seen with ligamentous injury, old or acute.

## 2020-01-18 ENCOUNTER — Ambulatory Visit: Payer: BC Managed Care – PPO | Admitting: Dermatology
